# Patient Record
Sex: Female | Born: 1985 | Race: White | Hispanic: No | Marital: Married
Health system: Southern US, Community
[De-identification: ages and names within clinical notes are randomized; demographics above are authoritative.]

## PROBLEM LIST (undated history)

## (undated) DIAGNOSIS — G932 Benign intracranial hypertension: Secondary | ICD-10-CM

## (undated) DIAGNOSIS — G43909 Migraine, unspecified, not intractable, without status migrainosus: Secondary | ICD-10-CM

## (undated) DIAGNOSIS — E079 Disorder of thyroid, unspecified: Secondary | ICD-10-CM

## (undated) HISTORY — PX: LUMBAR PUNCTURE: SHX1985

## (undated) HISTORY — PX: LYMPH NODE BIOPSY: SHX201

## (undated) HISTORY — PX: TONSILLECTOMY: SUR1361

---

## 2020-02-10 ENCOUNTER — Emergency Department: Payer: 59

## 2020-02-10 ENCOUNTER — Encounter: Payer: Self-pay | Admitting: Emergency Medicine

## 2020-02-10 ENCOUNTER — Emergency Department
Admission: EM | Admit: 2020-02-10 | Discharge: 2020-02-10 | Disposition: A | Discharge status: Home / Self Care | Payer: 59 | Attending: Emergency Medicine | Admitting: Emergency Medicine

## 2020-02-10 ENCOUNTER — Other Ambulatory Visit: Payer: Self-pay

## 2020-02-10 DIAGNOSIS — R0789 Other chest pain: Secondary | ICD-10-CM | POA: Insufficient documentation

## 2020-02-10 DIAGNOSIS — M542 Cervicalgia: Secondary | ICD-10-CM | POA: Diagnosis not present

## 2020-02-10 DIAGNOSIS — M5410 Radiculopathy, site unspecified: Secondary | ICD-10-CM | POA: Diagnosis not present

## 2020-02-10 DIAGNOSIS — R202 Paresthesia of skin: Secondary | ICD-10-CM | POA: Diagnosis not present

## 2020-02-10 DIAGNOSIS — M79602 Pain in left arm: Secondary | ICD-10-CM | POA: Diagnosis present

## 2020-02-10 DIAGNOSIS — M792 Neuralgia and neuritis, unspecified: Secondary | ICD-10-CM

## 2020-02-10 LAB — CBC
HCT: 40.1 % (ref 36.0–46.0)
Hemoglobin: 13.6 g/dL (ref 12.0–15.0)
MCH: 27.5 pg (ref 26.0–34.0)
MCHC: 33.9 g/dL (ref 30.0–36.0)
MCV: 81 fL (ref 80.0–100.0)
Platelets: 221 10*3/uL (ref 150–400)
RBC: 4.95 MIL/uL (ref 3.87–5.11)
RDW: 13.6 % (ref 11.5–15.5)
WBC: 8 10*3/uL (ref 4.0–10.5)
nRBC: 0 % (ref 0.0–0.2)

## 2020-02-10 LAB — BASIC METABOLIC PANEL
Anion gap: 10 (ref 5–15)
BUN: 12 mg/dL (ref 6–20)
CO2: 28 mmol/L (ref 22–32)
Calcium: 9.6 mg/dL (ref 8.9–10.3)
Chloride: 102 mmol/L (ref 98–111)
Creatinine, Ser: 0.66 mg/dL (ref 0.44–1.00)
GFR calc Af Amer: >60 mL/min (ref >60)
GFR calc non Af Amer: >60 mL/min (ref >60)
Glucose, Bld: 88 mg/dL (ref 70–99)
Potassium: 3.8 mmol/L (ref 3.5–5.1)
Sodium: 140 mmol/L (ref 135–145)

## 2020-02-10 LAB — POCT PREGNANCY, URINE: Preg Test, Ur: NEGATIVE

## 2020-02-10 LAB — TROPONIN I (HIGH SENSITIVITY): Troponin I (High Sensitivity): <2 ng/L (ref <18)

## 2020-02-10 MED ORDER — PREDNISONE 20 MG PO TABS: 40.0000 mg | ORAL_TABLET | Freq: Every day | ORAL | 0 refills | Status: DC

## 2020-02-10 MED ORDER — PREDNISONE 20 MG PO TABS
40.0000 mg | ORAL_TABLET | Freq: Once | ORAL | Status: AC
  Administered 2020-02-10: 40 mg via ORAL
  Filled 2020-02-10: qty 2

## 2020-02-10 NOTE — ED Provider Notes (Signed)
[facility] Emergency Department Provider Note   ____________________________________________   First MD Initiated Contact with Patient 02/10/20 1815     (approximate)  I have reviewed the triage vital signs and the nursing notes.   HISTORY  Chief Complaint Numbness, Chest Pain, and Leg Pain    HPI Mariah Acosta is a 34 y.o. female reports no significant past medical history  Patient reports that after driving home from work a couple days ago she noticed that she was having some symptoms of a tingling feeling in the fingers of her left hand it would seem to have a lancinating discomfort and sometimes a burning or weird feeling seems to start at the base of her neck goes down towards the left hand or sometimes from the left hand up towards her neck and across the very front of her upper chest collarbone area  She is also noticed a couple of times where she felt like she was having some tingling in her toes as well as fingers of both hands.  Her symptoms are improved right now, but seem to be worsened when she works or does things like driving and particularly affects the left arm  Mild discomfort in the left neck a   Does not believe herself to be pregnant.  No abdominal pain.  Denies having "chest pain" rather reports is really more like an upper chest but associated with a feeling of tingling in strange sensation involving the left hand  No weakness except while using the hand on a couple occasions she has no other just feels a little different slightly weak but it goes away pretty quickly after allowing the hand to rest  No falls or injuries.  No fevers.  No headache.  No difficulty walking.  No speech changes.  No past medical history on file.  She denies medical history  There are no problems to display for this patient.    Takes no medication  Patient has no known allergies.  No family history on file.  Social History Social History     Tobacco Use  . Smoking status: Not on file  Substance Use Topics  . Alcohol use: Not on file  . Drug use: Not on file  Denies any alcohol use, denies any smoking history  Review of Systems Constitutional: No fever/chills Eyes: No visual changes. ENT: No sore throat.  Some slight achiness in the left side of her lower neck at times over the last few days Cardiovascular: Denies chest pain. Respiratory: Denies shortness of breath. Gastrointestinal: No abdominal pain.   Genitourinary: Denies pregnancy Musculoskeletal: Negative for back pain. Skin: Negative for rash. Neurological: Negative for headaches    ____________________________________________   PHYSICAL EXAM:  VITAL SIGNS: ED Triage Vitals [02/10/20 1341]  Enc Vitals Group     BP (!) 144/95     Pulse Rate 88     Resp 18     Temp 98.3 F (36.8 C)     Temp Source Oral     SpO2 100 %     Weight (!) 310 lb (140.6 kg)     Height 5\' 10"  (1.778 m)     Head Circumference      Peak Flow      Pain Score 7     Pain Loc      Pain Edu?      Excl. in [place]?     Constitutional: Alert and oriented. Well appearing and in no acute distress. Eyes: Conjunctivae  are normal. Head: Atraumatic. Nose: No congestion/rhinnorhea. Mouth/Throat: Mucous membranes are moist. Neck: No stridor.  Cardiovascular: Normal rate, regular rhythm. Grossly normal heart sounds.  Good peripheral circulation. Respiratory: Normal respiratory effort.  No retractions. Lungs CTAB. Gastrointestinal: Soft and nontender. No distention. Musculoskeletal: No lower extremity tenderness nor edema. Neurologic:  Normal speech and language. No gross focal neurologic deficits are appreciated.  Skin:  Skin is warm, dry and intact. No rash noted. Psychiatric: Mood and affect are normal. Speech and behavior are normal.  ____________________________________________   LABS (all labs ordered are listed, but only abnormal results are displayed)  Labs Reviewed   BASIC METABOLIC PANEL  CBC  POC URINE PREG, ED  POCT PREGNANCY, URINE  TROPONIN I (HIGH SENSITIVITY)   ____________________________________________  EKG  ED ECG REPORT I, Sharyn Creamer, the attending physician, personally viewed and interpreted this ECG.  Date: 02/11/2020 EKG Time: 1345 Rate: 85 Rhythm: normal sinus rhythm QRS Axis: normal Intervals: normal ST/T Wave abnormalities: Very minimal nonspecific abnormality Narrative Interpretation: no evidence of acute ischemia, very minimal nonspecific T wave abnormality  ____________________________________________  RADIOLOGY  DG Chest 2 View  Result Date: 02/10/2020 CLINICAL DATA:  Chest pain EXAM: CHEST - 2 VIEW COMPARISON:  None. FINDINGS: Lungs are clear. Heart size and pulmonary vascularity are normal. No adenopathy. No pneumothorax. No bone lesions. IMPRESSION: Lungs clear.  Cardiac silhouette normal. Electronically Signed   By: Bretta Bang III M.D.   On: 02/10/2020 14:34    Chest x-ray normal ____________________________________________   PROCEDURES  Procedure(s) performed: None  Procedures  Critical Care performed: No  ____________________________________________   INITIAL IMPRESSION / ASSESSMENT AND PLAN / ED COURSE  Pertinent labs & imaging results that were available during my care of the patient were reviewed by me and considered in my medical decision making (see chart for details).   Patient transfer symptoms that seem to indicate radicular pain likely originating possibly from an left lower cervical neck region.  No signs or symptoms suggest acute focal abnormality or severe spinal compression.  Very reassuring examination normal neurologic exam at this time.  No pronator drift.  Good use of both upper extremities.  Normal median radial ulnar nerve examination of the left hand.  Strong radial pulse left hand  Symptoms very atypical seem to suggest likely neuropathic type pain I suspect  potentially from mild cervical radiculopathy or alternatively other causes such as carpal tunnel type etiology.    No evidence of ACS  HEAR Score: 1    Discussed with the patient, will trial steroid and traditional over-the-counter treatment such as ibuprofen which I recommended to her.  Discussed careful return precautions, advised that and recommend she follow-up with her primary care physician which she is agreeable with.  Advised her if her symptoms are worsening she will return anytime, will consider potentially obtaining MRI of her neck if the symptoms continue to worsen or further work-up as needed.  Return precautions and treatment recommendations and follow-up discussed with the patient who is agreeable with the plan.       ____________________________________________   FINAL CLINICAL IMPRESSION(S) / ED DIAGNOSES  Final diagnoses:  Radicular pain in left arm  Paresthesia        Note:  This document was prepared using Dragon voice recognition software and may include unintentional dictation errors       Sharyn Creamer, MD 02/11/20 [phone]

## 2020-02-10 NOTE — Discharge Instructions (Signed)
Please follow-up closely with your primary care doctor.  Return to the emergency room right away if you start developing muscle weakness, loss of sensation, chest pain, fever, trouble breathing or other new concerns or symptoms arise.

## 2020-02-10 NOTE — ED Triage Notes (Signed)
Pt comes POV with chest pain, nausea, bilateral leg pain, and reports left arm tingling 2 days ago.

## 2022-01-19 ENCOUNTER — Other Ambulatory Visit: Payer: Self-pay

## 2022-01-19 ENCOUNTER — Emergency Department: Payer: Commercial Managed Care - PPO

## 2022-01-19 ENCOUNTER — Emergency Department
Admission: EM | Admit: 2022-01-19 | Discharge: 2022-01-19 | Disposition: A | Discharge status: Home / Self Care | Payer: Commercial Managed Care - PPO | Attending: Emergency Medicine | Admitting: Emergency Medicine

## 2022-01-19 DIAGNOSIS — R0602 Shortness of breath: Secondary | ICD-10-CM | POA: Diagnosis not present

## 2022-01-19 DIAGNOSIS — R079 Chest pain, unspecified: Secondary | ICD-10-CM | POA: Insufficient documentation

## 2022-01-19 DIAGNOSIS — M79605 Pain in left leg: Secondary | ICD-10-CM | POA: Diagnosis present

## 2022-01-19 LAB — CBC
HCT: 41.4 % (ref 36.0–46.0)
Hemoglobin: 13.2 g/dL (ref 12.0–15.0)
MCH: 27 pg (ref 26.0–34.0)
MCHC: 31.9 g/dL (ref 30.0–36.0)
MCV: 84.7 fL (ref 80.0–100.0)
Platelets: 241 10*3/uL (ref 150–400)
RBC: 4.89 MIL/uL (ref 3.87–5.11)
RDW: 13.2 % (ref 11.5–15.5)
WBC: 9.8 10*3/uL (ref 4.0–10.5)
nRBC: 0 % (ref 0.0–0.2)

## 2022-01-19 LAB — BASIC METABOLIC PANEL
Anion gap: 5 (ref 5–15)
BUN: 11 mg/dL (ref 6–20)
CO2: 31 mmol/L (ref 22–32)
Calcium: 9.4 mg/dL (ref 8.9–10.3)
Chloride: 103 mmol/L (ref 98–111)
Creatinine, Ser: 0.68 mg/dL (ref 0.44–1.00)
GFR, Estimated: >60 mL/min (ref >60)
Glucose, Bld: 101 mg/dL — ABNORMAL HIGH (ref 70–99)
Potassium: 4 mmol/L (ref 3.5–5.1)
Sodium: 139 mmol/L (ref 135–145)

## 2022-01-19 LAB — TROPONIN I (HIGH SENSITIVITY): Troponin I (High Sensitivity): 3 ng/L (ref <18)

## 2022-01-19 MED ORDER — CYCLOBENZAPRINE HCL 5 MG PO TABS: 5.0000 mg | ORAL_TABLET | Freq: Three times a day (TID) | ORAL | 0 refills | Status: DC | PRN

## 2022-01-19 NOTE — ED Triage Notes (Signed)
Pt to ED for left leg pain that started a few days ago, now reports has progressed to intermittent shob and chest pain. Reports worsening leg pain with movement. Denies chest pain at this time.  ?Ambulatory to triage with NAD noted  ?

## 2022-01-19 NOTE — ED Provider Notes (Signed)
? ? ?[facility] ?Emergency Department Provider Note ? ? ? ? Event Date/Time  ? First MD Initiated Contact with Patient 01/19/22 1140   ?  (approximate) ? ? ?History  ? ?Leg Pain ? ? ?HPI ? ?Mariah Acosta is a 36 y.o. 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  Patient also reports intermittent episodes of shortness of breath and chest pain.  She reports her left leg pain is aggravated by movement.  She denies any current chest pain, hemoptysis, or weakness.  She also denies any recent injury, trauma, or falls.  He does endorse some seasonal allergies causing postnasal drainage and some chest tightness, and a minor cough. ? ? ?Physical Exam  ? ?Triage Vital Signs: ?ED Triage Vitals  ?Enc Vitals Group  ?   BP 01/19/22 1047 (!) 140/108  ?   Pulse Rate 01/19/22 1047 86  ?   Resp 01/19/22 1047 18  ?   Temp 01/19/22 1047 98.5 ?F (36.9 ?C)  ?   Temp Source 01/19/22 1047 Oral  ?   SpO2 01/19/22 1047 98 %  ?   Weight 01/19/22 1048 298 lb (135.2 kg)  ?   Height 01/19/22 1048 5\' 9"  (1.753 m)  ?   Head Circumference --   ?   Peak Flow --   ?   Pain Score 01/19/22 1048 5  ?   Pain Loc --   ?   Pain Edu? --   ?   Excl. in [place]? --   ? ? ?Most recent vital signs: ?Vitals:  ? 01/19/22 1047 01/19/22 1228  ?BP: (!) 140/108 (!) 138/99  ?Pulse: 86 88  ?Resp: 18 18  ?Temp: 98.5 ?F (36.9 ?C)   ?SpO2: 98% 98%  ? ? ?General Awake, no distress.  ?CV:  Good peripheral perfusion. RRR ?RESP:  Normal effort. CTA ?ABD:  No distention.  ?MSK:  Normal spinal alignment without midline tenderness, spasm, vomiting, or step-off.  Full active range of motion of lower extremities bilaterally.  Left knee without deformity or effusion.  Patient with soft tissue swelling to the bilateral proximal anterior tibia. ?SKIN:  No ecchymosis, bruising, or erythema, or induration noted to the lower extremities. ? ? ?ED Results / Procedures /  Treatments  ? ?Labs ?(all labs ordered are listed, but only abnormal results are displayed) ?Labs Reviewed  ?BASIC METABOLIC PANEL - Abnormal; Notable for the following components:  ?    Result Value  ? Glucose, Bld 101 (*)   ? All other components within normal limits  ?CBC  ?POC URINE PREG, ED  ?TROPONIN I (HIGH SENSITIVITY)  ?TROPONIN I (HIGH SENSITIVITY)  ? ? ? ?EKG ? ?Vent. rate 91 BPM ?PR interval 156 ms ?QRS duration 78 ms ?QT/QTcB 360/442 ms ?P-R-T axes 34 -11 4 ?NSR ?Non specific ST changes ?No STEMI ? ? ?RADIOLOGY ? ?I personally viewed and evaluated these images as part of my medical decision making, as well as reviewing the written report by the radiologist. ? ?ED Provider Interpretation: no acute findings} ? ?DG Chest 2 View ? ?Result Date: 01/19/2022 ?CLINICAL DATA:  Shortness of breath. EXAM: CHEST - 2 VIEW COMPARISON:  02/10/2020 FINDINGS: The heart size and mediastinal contours are within normal limits. Both lungs are clear. The visualized skeletal structures are unremarkable. IMPRESSION: No active cardiopulmonary disease. Electronically Signed   By: 02/12/2020 M.D.   On: 01/19/2022  11:06  ? ?US Venous Img Lower Unilateral Left ? ?Result Date: 01/19/2022 ?CLINICAL DATA:  Left lower extremity pain, shortness of breath EXAM: LEFT LOWER EXTREMITY VENOUS DOPPLER ULTRASOUND TECHNIQUE: Gray-scale sonography with compression, as well as color and duplex ultrasound, were performed to evaluate the deep venous system(s) from the level of the common femoral vein through the popliteal and proximal calf veins. COMPARISON:  None Available. FINDINGS: VENOUS Normal compressibility of the common femoral, superficial femoral, and popliteal veins, as well as the visualized calf veins. Visualized portions of profunda femoral vein and great saphenous vein unremarkable. No filling defects to suggest DVT on grayscale or color Doppler imaging. Doppler waveforms show normal direction of venous flow, normal respiratory  plasticity and response to augmentation. Limited views of the contralateral common femoral vein are unremarkable. OTHER Incidental note is made of 2 prominent lymph nodes in the left groin, the largest of which measures up to 2.3 x 0.7 x 1.7 cm, which could be reactive. Limitations: none IMPRESSION: Negative for DVT in the left lower extremity. Electronically Signed   By: Wiliam Ke M.D.   On: 01/19/2022 12:05   ? ? ?PROCEDURES: ? ?Critical Care performed: No ? ?Procedures ? ? ?MEDICATIONS ORDERED IN ED: ?Medications - No data to display ? ? ?IMPRESSION / MDM / ASSESSMENT AND PLAN / ED COURSE  ?I reviewed the triage vital signs and the nursing notes. ?             ?               ? ?Differential diagnosis includes, but is not limited to, viral syndrome, bronchitis including COPD exacerbation, pneumonia, reactive airway disease including asthma, CHF including exacerbation with or without pulmonary/interstitial edema, pneumothorax, ACS, thoracic trauma, and pulmonary embolism. ? ? ?Patient to the ED for evaluation of anterior left leg pain that started few days ago.  Patient reports some intermittent cramping to the anterior leg and some intermittent episodes of cough and chest tightness.  She is evaluated for her complaints in the ED, found to have overall reassuring work-up.  No radiologic evidence of an acute intrathoracic process on chest x-ray, and ultrasound is negative for DVT.  Patient with a low pretest probability for PE/DVT based on her Wells criteria.  Low suspicion for ACS given patient's normal troponin and reassuring EKG without malignant arrhythmia.  Patient's diagnosis is consistent with leg pain and cramping. Patient will be discharged home with prescriptions for cyclobenzaprine. Patient is to follow up with her primary provider as needed or otherwise directed. Patient is given ED precautions to return to the ED for any worsening or new symptoms. ? ? ?FINAL CLINICAL IMPRESSION(S) / ED DIAGNOSES   ? ?Final diagnoses:  ?Left leg pain  ? ? ? ?Rx / DC Orders  ? ?ED Discharge Orders   ? ?      Ordered  ?  cyclobenzaprine (FLEXERIL) 5 MG tablet  3 times daily PRN       ? 01/19/22 1218  ? ?  ?  ? ?  ? ? ? ?Note:  This document was prepared using Dragon voice recognition software and may include unintentional dictation errors. ? ?  ?Lissa Hoard, PA-C ?01/19/22 1335 ? ?  ?Delton Prairie, MD ?01/19/22 1526 ? ?

## 2022-01-19 NOTE — Discharge Instructions (Addendum)
Your exam, labs, and ultrasound are normal and reassuring. You do not have evidence of a blood clot. Take the prescription muscle relaxant and follow-up with your provider for continued symptoms. Return to the ED if needed.  ?

## 2022-11-19 ENCOUNTER — Emergency Department
Admission: EM | Admit: 2022-11-19 | Discharge: 2022-11-19 | Disposition: A | Discharge status: Home / Self Care | Payer: Commercial Managed Care - PPO | Attending: Emergency Medicine | Admitting: Emergency Medicine

## 2022-11-19 ENCOUNTER — Other Ambulatory Visit: Payer: Self-pay

## 2022-11-19 ENCOUNTER — Emergency Department: Payer: Commercial Managed Care - PPO

## 2022-11-19 ENCOUNTER — Encounter: Payer: Self-pay | Admitting: Emergency Medicine

## 2022-11-19 DIAGNOSIS — I10 Essential (primary) hypertension: Secondary | ICD-10-CM | POA: Diagnosis not present

## 2022-11-19 DIAGNOSIS — R519 Headache, unspecified: Secondary | ICD-10-CM | POA: Diagnosis not present

## 2022-11-19 LAB — BASIC METABOLIC PANEL
Anion gap: 9 (ref 5–15)
BUN: <5 mg/dL — ABNORMAL LOW (ref 6–20)
CO2: 25 mmol/L (ref 22–32)
Calcium: 8.9 mg/dL (ref 8.9–10.3)
Chloride: 103 mmol/L (ref 98–111)
Creatinine, Ser: 0.61 mg/dL (ref 0.44–1.00)
GFR, Estimated: >60 mL/min (ref >60)
Glucose, Bld: 160 mg/dL — ABNORMAL HIGH (ref 70–99)
Potassium: 3.3 mmol/L — ABNORMAL LOW (ref 3.5–5.1)
Sodium: 137 mmol/L (ref 135–145)

## 2022-11-19 LAB — CBC WITH DIFFERENTIAL/PLATELET
Abs Immature Granulocytes: 0.02 10*3/uL (ref 0.00–0.07)
Basophils Absolute: 0 10*3/uL (ref 0.0–0.1)
Basophils Relative: 0 %
Eosinophils Absolute: 0.1 10*3/uL (ref 0.0–0.5)
Eosinophils Relative: 2 %
HCT: 35.7 % — ABNORMAL LOW (ref 36.0–46.0)
Hemoglobin: 11.4 g/dL — ABNORMAL LOW (ref 12.0–15.0)
Immature Granulocytes: 0 %
Lymphocytes Relative: 23 %
Lymphs Abs: 1.2 10*3/uL (ref 0.7–4.0)
MCH: 26 pg (ref 26.0–34.0)
MCHC: 31.9 g/dL (ref 30.0–36.0)
MCV: 81.3 fL (ref 80.0–100.0)
Monocytes Absolute: 0.2 10*3/uL (ref 0.1–1.0)
Monocytes Relative: 5 %
Neutro Abs: 3.6 10*3/uL (ref 1.7–7.7)
Neutrophils Relative %: 70 %
Platelets: 235 10*3/uL (ref 150–400)
RBC: 4.39 MIL/uL (ref 3.87–5.11)
RDW: 13.3 % (ref 11.5–15.5)
WBC: 5.1 10*3/uL (ref 4.0–10.5)
nRBC: 0 % (ref 0.0–0.2)

## 2022-11-19 LAB — URINALYSIS, ROUTINE W REFLEX MICROSCOPIC
Bilirubin Urine: NEGATIVE
Glucose, UA: 50 mg/dL — AB
Hgb urine dipstick: NEGATIVE
Ketones, ur: NEGATIVE mg/dL
Leukocytes,Ua: NEGATIVE
Nitrite: NEGATIVE
Protein, ur: NEGATIVE mg/dL
Specific Gravity, Urine: 1.006 (ref 1.005–1.030)
pH: 9 — ABNORMAL HIGH (ref 5.0–8.0)

## 2022-11-19 LAB — POC URINE PREG, ED: Preg Test, Ur: NEGATIVE

## 2022-11-19 MED ORDER — BUTALBITAL-APAP-CAFFEINE 50-325-40 MG PO TABS: 1.0000 | ORAL_TABLET | Freq: Four times a day (QID) | ORAL | 0 refills | Status: AC | PRN

## 2022-11-19 NOTE — ED Provider Notes (Signed)
[facility] Provider Note    Event Date/Time   First MD Initiated Contact with Patient 11/19/22 [phone]     (approximate)   History   Headache   HPI  Mariah Acosta is a 37 y.o. female   presents to the ED with complaint of left-sided headache for approximately 1 week.  Patient states that she saw her PCP and was placed on Augmentin for a potential sinusitis.  Patient states that she did not have any upper respiratory symptoms at the time, fever or dental/facial pain.  Patient states that the Augmentin has not not given her any relief.  She reports nausea mostly when she is eating.  No visual changes, no photophobia, no increased headache with positional changes or dizziness.  Patient also denies any upper or strange symptoms, fever, chills, nausea or vomiting.  No known sick exposures.  Patient reports that she had a diagnosis of sarcoidosis in 2004 but reports that she is "in remission" and has not had any problems in many years.  Patient has multiple visits to [facility] for muscle skeletal complaints for both PT and orthopedics.      Physical Exam   Triage Vital Signs: ED Triage Vitals [11/19/22 0832]  Enc Vitals Group     BP (!) 133/96     Pulse Rate 99     Resp 18     Temp 97.8 F (36.6 C)     Temp Source Oral     SpO2 100 %     Weight 298 lb 1 oz (135.2 kg)     Height '5\' 9"'$  (1.753 m)     Head Circumference      Peak Flow      Pain Score 9     Pain Loc      Pain Edu?      Excl. in [place]?     Most recent vital signs: Vitals:   11/19/22 1308 11/19/22 1309  BP: 132/76   Pulse: 80   Resp: 18   Temp:  98 F (36.7 C)  SpO2: 99%      General: Awake, no distress.  Alert, talkative, answers questions appropriately.  No photophobia appreciated in the exam room. CV:  Good peripheral perfusion.  Resp:  Normal effort.  Lungs are clear bilaterally. Abd:  No distention.  Other:  PERRLA, EOMI's, cranial nerves II through XII grossly intact.  Normal  speech, good muscle strength bilaterally at 5/5.  No tenderness on palpation of cervical spine posteriorly.  Range of motion in all 4 planes cervical spine is without restriction.  There is some tenderness noted on palpation of the trapezius muscles bilaterally.  Range of motion of the upper extremities is without restriction.   ED Results / Procedures / Treatments   Labs (all labs ordered are listed, but only abnormal results are displayed) Labs Reviewed  CBC WITH DIFFERENTIAL/PLATELET - Abnormal; Notable for the following components:      Result Value   Hemoglobin 11.4 (*)    HCT 35.7 (*)    All other components within normal limits  BASIC METABOLIC PANEL - Abnormal; Notable for the following components:   Potassium 3.3 (*)    Glucose, Bld 160 (*)    BUN <5 (*)    All other components within normal limits  URINALYSIS, ROUTINE W REFLEX MICROSCOPIC - Abnormal; Notable for the following components:   Color, Urine STRAW (*)    APPearance CLEAR (*)    pH 9.0 (*)  Glucose, UA 50 (*)    All other components within normal limits  POC URINE PREG, ED       RADIOLOGY CT head without contrast shows no acute intracranial abnormality per radiologist.  There is mention of partial empty sella which can be seen in the setting of a idiopathic intracranial hypertension per radiology.  MRI CT without contrast per radiologist mentions the same partial empty sella which may be a normal anatomical variant versus findings that could be associated to intracranial hypertension.  PROCEDURES:  Critical Care performed:   Procedures   MEDICATIONS ORDERED IN ED: Medications - No data to display   IMPRESSION / MDM / [place] / ED COURSE  I reviewed the triage vital signs and the nursing notes.   Differential diagnosis includes, but is not limited to, migraine, muscle contraction headache, generalized headache, sinusitis.   I spoke with the neurologist on-call for [facility] Dr. Cristi Loron  about this patient and CT findings.  It was noted that patient does have a history of sarcoidosis per [facility] records but not noted in her [facility] ED chart.  An MRI brain without contrast was ordered to further evaluate the sella and patient was made aware.  She reports that she does not have claustrophobia and does not need sedation.  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  Plans are made for patient to follow-up with neurology.  I discussed MRI findings with patient and she is agreeable to follow-up with neurology over [facility].  A prescription for Fioricet was sent to the pharmacy for her to take 1 or 2 every 6 hours as needed for headache to see if this gives her any relief and also encouraged her to use ice or heat to the base of her neck for her muscles.     Patient's presentation is most consistent with acute complicated illness / injury requiring diagnostic workup.  FINAL CLINICAL IMPRESSION(S) / ED DIAGNOSES   Final diagnoses:  Left-sided headache     Rx / DC Orders   ED Discharge Orders          Ordered    butalbital-acetaminophen-caffeine (FIORICET) 50-325-40 MG tablet  Every 6 hours PRN        11/19/22 1548             Note:  This document was prepared using Dragon voice recognition software and may include unintentional dictation errors.   Johnn Hai, PA-C 11/19/22 1557    Arta Silence, MD 11/19/22 2005

## 2022-11-19 NOTE — Plan of Care (Signed)
Discussed with ED PA, head CT read as partial empty sella which can be seen in idiopathic intracranial hypotension, and I am asked to comment on any further workup she needs prior to discharge from ED  Per ED provider, patient has no alarm signs on her headache including no positionality, not waking her up at night, not worse when she is laying down, no vision changes.    On review of chart there is a history of migraine headache listed as well as sarcoid listed but this history was not elicited by ED provider.  Per ED provider exam is completely benign  Recommendations: If patient endorses that the history of sarcoidosis listed on her chart is accurate, would recommend MRI brain with and without contrast to exclude neurosarcoidosis Do not think a lumbar puncture is necessary if headache is not positional and patient is not having any vision symptoms  These are curbside recommendations based upon the information readily available in the chart on brief review as well as history and examination information provided to me by requesting provider and do not replace a full detailed consult

## 2022-11-19 NOTE — ED Triage Notes (Signed)
Pt here with a left sided headache x1 week. Pt states her provider put her on Augmentin with no relief. Pt states she is also nauseous, mostly after she eats.

## 2022-11-19 NOTE — Discharge Instructions (Signed)
Make an appointment with Dr. Gurney Maxin who is a neurologist at [facility] or his partner Dr. Manuella Ghazi.  A prescription for Fioricet was sent to your pharmacy to take 1 or 2 every 6 hours as needed for headache.  As we discussed do not drive or operate machinery while taking this medication as it could cause drowsiness.  You may also apply moist heat or ice to the base of your neck to see if this helps with your muscle tension.

## 2022-12-17 ENCOUNTER — Other Ambulatory Visit: Payer: Self-pay | Admitting: Physician Assistant

## 2022-12-17 DIAGNOSIS — G932 Benign intracranial hypertension: Secondary | ICD-10-CM

## 2022-12-22 NOTE — Progress Notes (Signed)
Patient for DG Lumbar Puncture on Wed 12/23/2022, I called and spoke with the patient on the phone and gave pre-procedure instructions. Pt was made aware to be here at 9:30a. Pt stated understanding.  Called 12/21/2022 and 12/22/2022

## 2022-12-23 ENCOUNTER — Ambulatory Visit
Admission: RE | Admit: 2022-12-23 | Discharge: 2022-12-23 | Disposition: A | Discharge status: Home / Self Care | Payer: 59 | Source: Ambulatory Visit | Attending: Physician Assistant | Admitting: Physician Assistant

## 2022-12-23 ENCOUNTER — Other Ambulatory Visit: Payer: Self-pay

## 2022-12-23 DIAGNOSIS — G932 Benign intracranial hypertension: Secondary | ICD-10-CM

## 2022-12-23 MED ORDER — ACETAMINOPHEN 325 MG PO TABS: 650.0000 mg | ORAL_TABLET | ORAL | Status: DC | PRN

## 2022-12-23 NOTE — Procedures (Addendum)
Technically successful fluoro guided LP at L4-L5 level with opening pressure of 23 cm H2O and closing pressure of <18cm H2O. 10 cc of clear CSF collected, but no labs were ordered as this was a therapeutic lumbar puncture for pseudotumor cerebri.  No immediate post procedural complication.  Please see imaging section of Epic for full dictation.    Mina Marble, PA-C 12/23/2022, 11:35 AM

## 2022-12-30 ENCOUNTER — Ambulatory Visit: Payer: 59

## 2023-01-12 NOTE — Addendum Note (Signed)
Encounter addended by: Jeanella Flattery, RN on: 01/12/2023 5:18 PM  Actions taken: Charge Capture section accepted

## 2023-01-20 ENCOUNTER — Ambulatory Visit
Admission: EM | Admit: 2023-01-20 | Discharge: 2023-01-20 | Disposition: A | Discharge status: Home / Self Care | Payer: Commercial Managed Care - PPO | Attending: Emergency Medicine | Admitting: Emergency Medicine

## 2023-01-20 DIAGNOSIS — H109 Unspecified conjunctivitis: Secondary | ICD-10-CM | POA: Diagnosis not present

## 2023-01-20 DIAGNOSIS — R030 Elevated blood-pressure reading, without diagnosis of hypertension: Secondary | ICD-10-CM

## 2023-01-20 DIAGNOSIS — J302 Other seasonal allergic rhinitis: Secondary | ICD-10-CM

## 2023-01-20 HISTORY — DX: Migraine, unspecified, not intractable, without status migrainosus: G43.909

## 2023-01-20 HISTORY — DX: Disorder of thyroid, unspecified: E07.9

## 2023-01-20 MED ORDER — POLYMYXIN B-TRIMETHOPRIM 10000-0.1 UNIT/ML-% OP SOLN: 1.0000 [drp] | Freq: Four times a day (QID) | OPHTHALMIC | 0 refills | Status: AC

## 2023-01-20 NOTE — Discharge Instructions (Addendum)
Avoid over-the-counter products that can elevate your blood pressure.  Your blood pressure is elevated today at 134/91.  Please have this rechecked by your primary care provider in 2-4 weeks.      Use the antibiotic eyedrops as prescribed.    Follow-up with your primary care provider if your symptoms are not improving.

## 2023-01-20 NOTE — ED Provider Notes (Signed)
[name]    CSN: [id number] Arrival date & time: 01/20/23  1450      History   Chief Complaint Chief Complaint  Patient presents with   Nasal Congestion    Sore throat for few days turned into sinus congestion and my eye is of concern - Entered by patient    HPI Mariah Acosta is a 37 y.o. female.   Patient presents with 5 day history of nasal congestion.  Treating with Mucinex severe cold medicine.  She also reports left eye drainage, redness, itching since this morning.  No eye injury, eye pain, change in vision.  No fever, chills, ear pain, sore throat, shortness of breath, or other symptoms.  Her medical history includes idiopathic intracranial hypertension, sarcoidosis, migraine headaches, obesity, tinnitus, hyperinsulinemia, asthma, anxiety, thyroid disease.  The history is provided by the patient, the spouse and medical records.    Past Medical History:  Diagnosis Date   Migraines    Thyroid disease     There are no problems to display for this patient.   Past Surgical History:  Procedure Laterality Date   LUMBAR PUNCTURE     LYMPH NODE BIOPSY     TONSILLECTOMY      OB History   No obstetric history on file.      Home Medications    Prior to Admission medications   Medication Sig Start Date End Date Taking? Authorizing Provider  trimethoprim-polymyxin b (POLYTRIM) ophthalmic solution Place 1 drop into both eyes 4 (four) times daily for 7 days. 01/20/23 01/27/23 Yes Mickie Bail, NP  acetaZOLAMIDE (DIAMOX) 250 MG tablet Take 250 mg by mouth 2 (two) times daily.    [provider]  amitriptyline (ELAVIL) 10 MG tablet Take 10 mg by mouth at bedtime.    [provider]  butalbital-acetaminophen-caffeine (FIORICET) 50-325-40 MG tablet Take 1-2 tablets by mouth every 6 (six) hours as needed for headache. 11/19/22 11/19/23  Tommi Rumps, PA-C  escitalopram (LEXAPRO) 20 MG tablet Take 20 mg by mouth daily.    [provider]   levothyroxine (SYNTHROID) 75 MCG tablet Take 75 mcg by mouth daily before breakfast.    [provider]  metformin (FORTAMET) 500 MG (OSM) 24 hr tablet Take 500 mg by mouth 2 (two) times daily with a meal.    [provider]    Family History History reviewed. No pertinent family history.  Social History Social History   Tobacco Use   Smoking status: Unknown   Smokeless tobacco: Never  Vaping Use   Vaping Use: Never used  Substance Use Topics   Alcohol use: Not Currently   Drug use: Not Currently     Allergies   Sumatriptan   Review of Systems Review of Systems  Constitutional:  Negative for chills and fever.  HENT:  Positive for congestion. Negative for ear pain and sore throat.   Eyes:  Positive for discharge, redness and itching. Negative for pain and visual disturbance.  Respiratory:  Positive for cough. Negative for shortness of breath.   Cardiovascular:  Negative for chest pain and palpitations.     Physical Exam Triage Vital Signs ED Triage Vitals  Enc Vitals Group     BP      Pulse      Resp      Temp      Temp src      SpO2      Weight      Height  Head Circumference      Peak Flow      Pain Score      Pain Loc      Pain Edu?      Excl. in GC?    No data found.  Updated Vital Signs BP (!) 134/91   Pulse 99   Temp 98.1 F (36.7 C)   Resp 18   LMP 01/16/2023   SpO2 96%   Visual Acuity Right Eye Distance:   Left Eye Distance:   Bilateral Distance:    Right Eye Near:   Left Eye Near:    Bilateral Near:     Physical Exam Vitals and nursing note reviewed.  Constitutional:      General: She is not in acute distress.    Appearance: She is well-developed. She is not ill-appearing.  HENT:     Right Ear: Tympanic membrane normal.     Left Ear: Tympanic membrane normal.     Nose: Nose normal.     Mouth/Throat:     Mouth: Mucous membranes are moist.     Pharynx: Oropharynx is clear.  Eyes:     General: Lids are  normal. Vision grossly intact.        Right eye: No discharge.     Extraocular Movements: Extraocular movements intact.     Conjunctiva/sclera:     Left eye: Left conjunctiva is injected.     Pupils: Pupils are equal, round, and reactive to light.     Comments: Green-yellow thick mucous drainage in left inner canthus.   Cardiovascular:     Rate and Rhythm: Normal rate and regular rhythm.     Heart sounds: Normal heart sounds.  Pulmonary:     Effort: Pulmonary effort is normal. No respiratory distress.     Breath sounds: Normal breath sounds.  Musculoskeletal:     Cervical back: Neck supple.  Skin:    General: Skin is warm and dry.  Neurological:     Mental Status: She is alert.  Psychiatric:        Mood and Affect: Mood normal.        Behavior: Behavior normal.      UC Treatments / Results  Labs (all labs ordered are listed, but only abnormal results are displayed) Labs Reviewed - No data to display  EKG   Radiology No results found.  Procedures Procedures (including critical care time)  Medications Ordered in UC Medications - No data to display  Initial Impression / Assessment and Plan / UC Course  I have reviewed the triage vital signs and the nursing notes.  Pertinent labs & imaging results that were available during my care of the patient were reviewed by me and considered in my medical decision making (see chart for details).   Left eye conjunctivitis, seasonal allergies, elevated blood pressure reading.  Cautioned patient to avoid OTC medications that can elevate her blood pressure.  Discussed that her blood pressure is mildly elevated today and instructed her to follow-up with her PCP for a recheck.  Instructed her to follow-up with her PCP or neurologist before taking OTC medications.  Treating conjunctivitis with Polytrim eyedrops.  Education provided on conjunctivitis and allergic rhinitis.  Instructed her to follow-up with her PCP if her symptoms are not  improving.  She agrees to plan of care.   Final Clinical Impressions(s) / UC Diagnoses   Final diagnoses:  Conjunctivitis of left eye, unspecified conjunctivitis type  Seasonal allergies  Elevated blood pressure reading  Discharge Instructions      Avoid over-the-counter products that can elevate your blood pressure.  Your blood pressure is elevated today at 134/91.  Please have this rechecked by your primary care provider in 2-4 weeks.      Use the antibiotic eyedrops as prescribed.    Follow-up with your primary care provider if your symptoms are not improving.        ED Prescriptions     Medication Sig Dispense Auth. Provider   trimethoprim-polymyxin b (POLYTRIM) ophthalmic solution Place 1 drop into both eyes 4 (four) times daily for 7 days. 10 mL Mickie Bail, NP      PDMP not reviewed this encounter.   Mickie Bail, NP 01/20/23 1525

## 2023-01-20 NOTE — ED Triage Notes (Signed)
Patient to Urgent Care with complaints of nasal congestion. Reports concerns about a possible sinus infection. Denies any fevers.  Also w/ complaints of left sided eye pain/ swelling/ drainage. Has been matted shut when she wakes up.   Symptoms started Friday. Has been taking mucinex.

## 2023-06-10 ENCOUNTER — Other Ambulatory Visit: Payer: Self-pay | Admitting: Physician Assistant

## 2023-06-10 DIAGNOSIS — M542 Cervicalgia: Secondary | ICD-10-CM

## 2023-06-16 ENCOUNTER — Ambulatory Visit
Admission: RE | Admit: 2023-06-16 | Discharge: 2023-06-16 | Disposition: A | Discharge status: Home / Self Care | Payer: Commercial Managed Care - PPO | Source: Ambulatory Visit | Attending: Physician Assistant | Admitting: Physician Assistant

## 2023-06-16 DIAGNOSIS — M542 Cervicalgia: Secondary | ICD-10-CM

## 2023-12-17 IMAGING — US US EXTREM LOW VENOUS*L*
1 series · 14 of 24 positions shown · non-contrast
Comparison: None Available.

CLINICAL DATA: Left lower extremity pain, shortness of breath

EXAM:
LEFT LOWER EXTREMITY VENOUS DOPPLER ULTRASOUND
TECHNIQUE: Gray-scale sonography with compression, as well as color and duplex
ultrasound, were performed to evaluate the deep venous system(s)
from the level of the common femoral vein through the popliteal and
proximal calf veins.

[Series 1: us venous img lower uni left (dvt) · portal-venous · 14 of 42 slices shown]
[im 1/42]
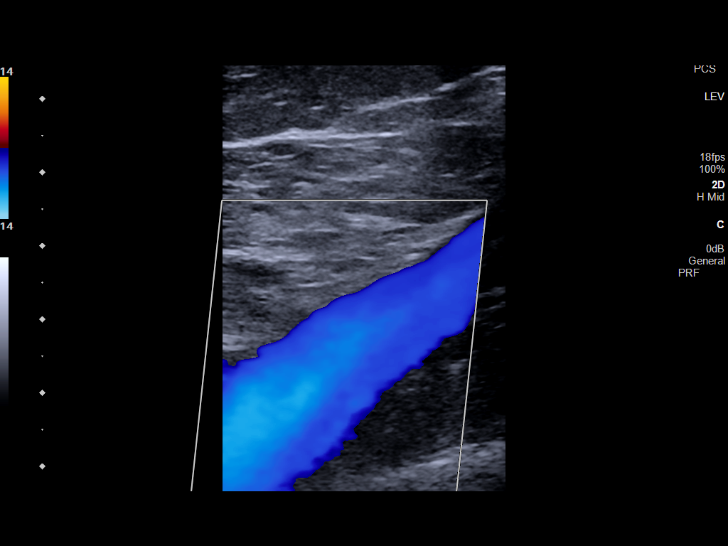
[im 4/42]
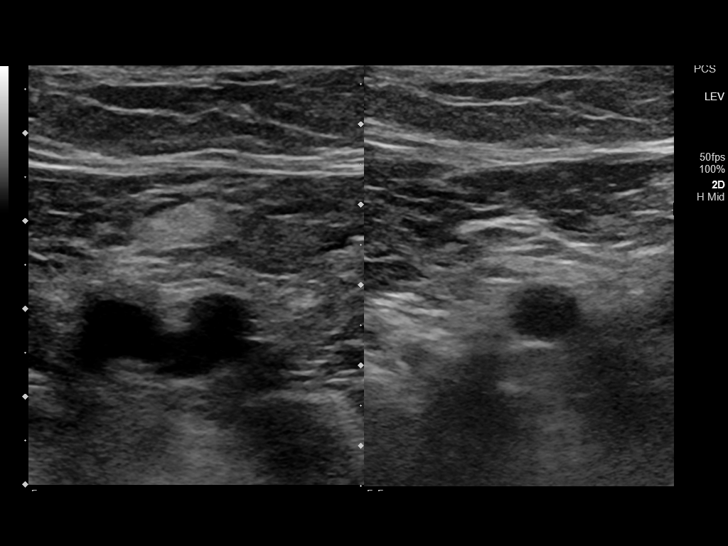
[im 8/42]
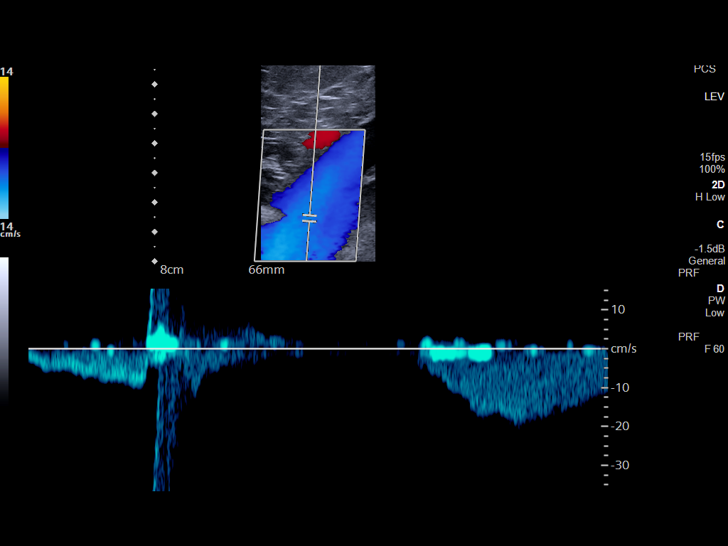
[im 11/42]
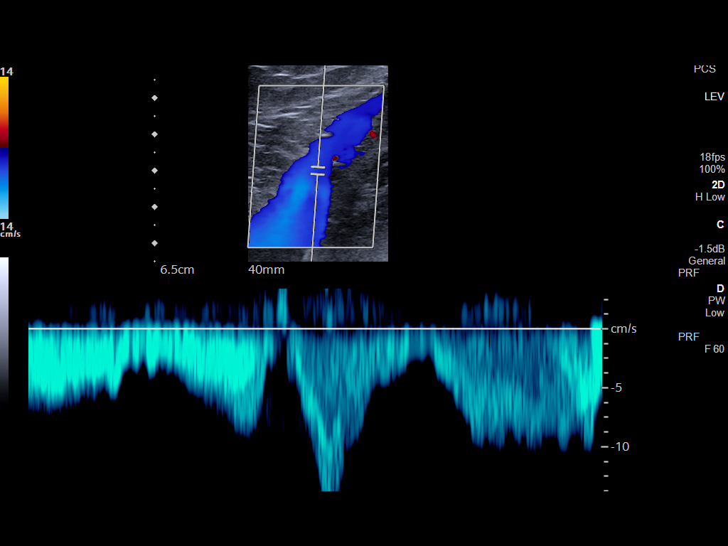
[im 13/42]
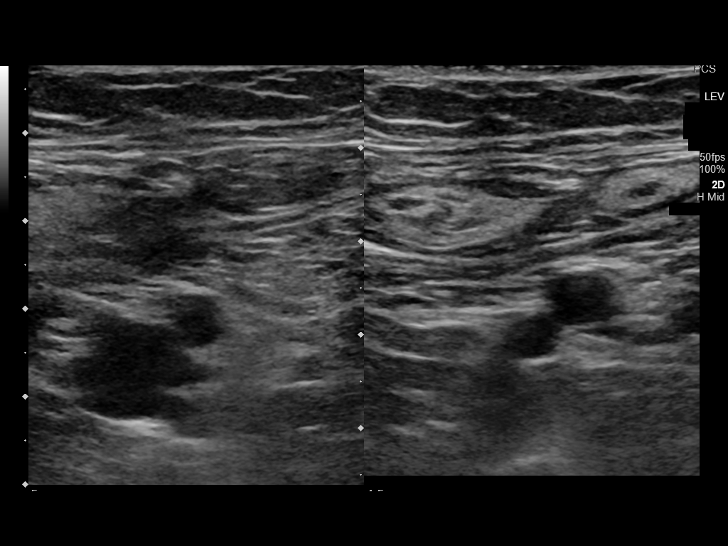
[im 17/42]
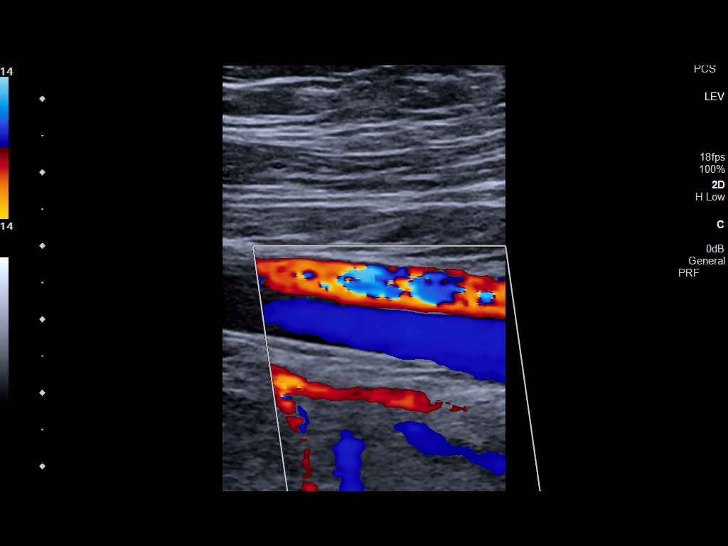
[im 20/42]
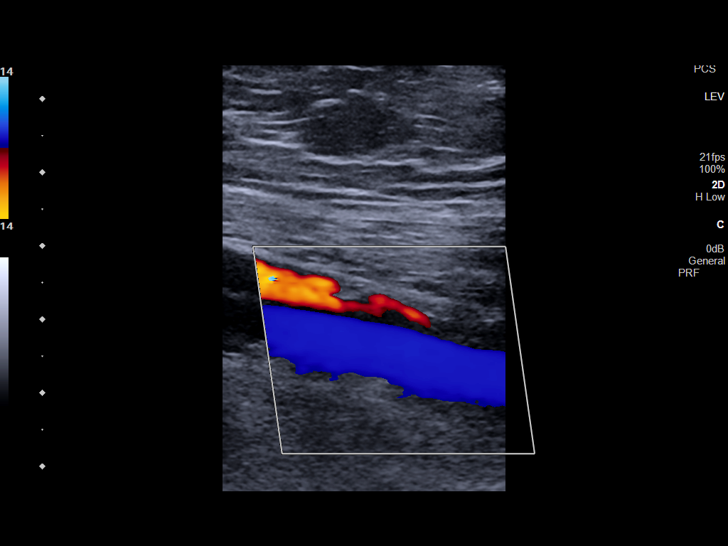
[im 22/42]
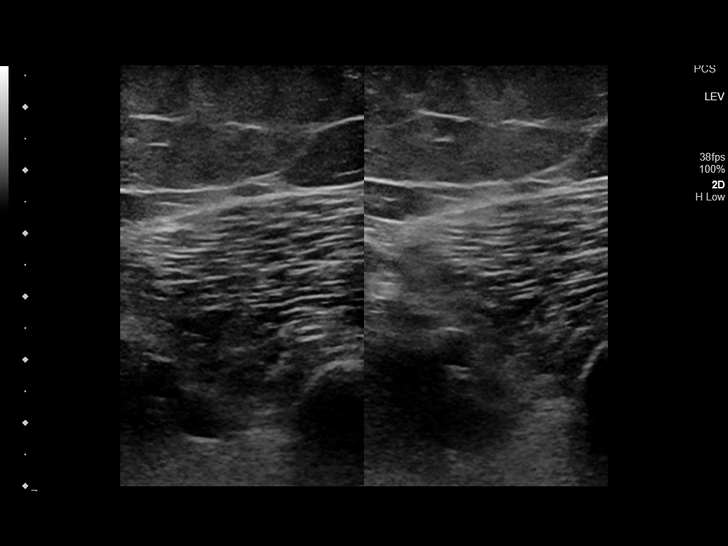
[im 25/42]
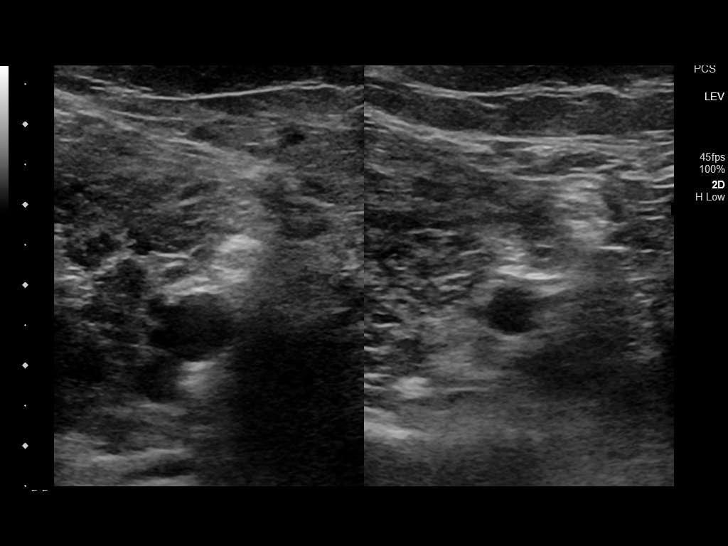
[im 29/42]
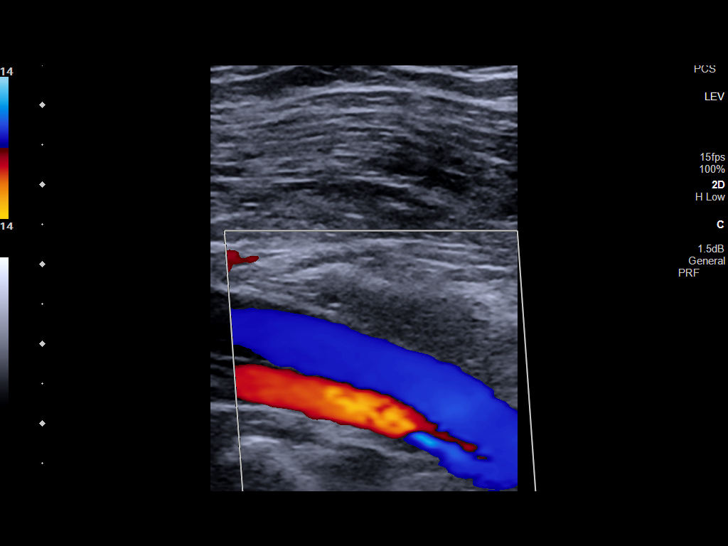
[im 33/42]
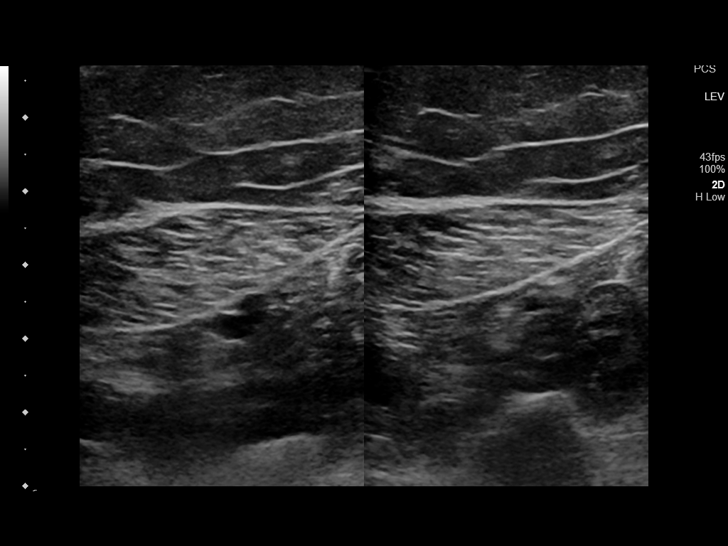
[im 34/42]
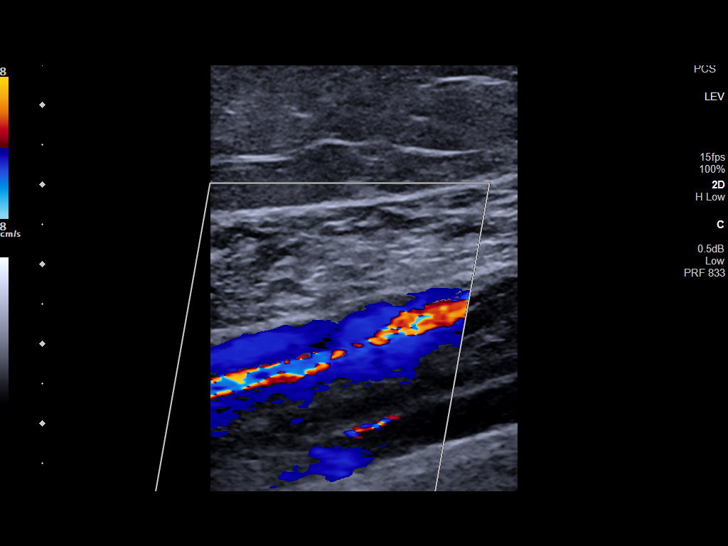
[im 38/42]
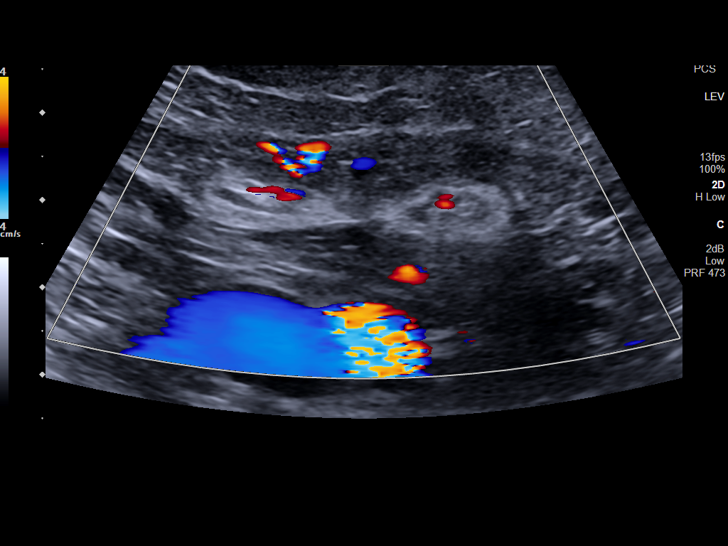
[im 42/42]
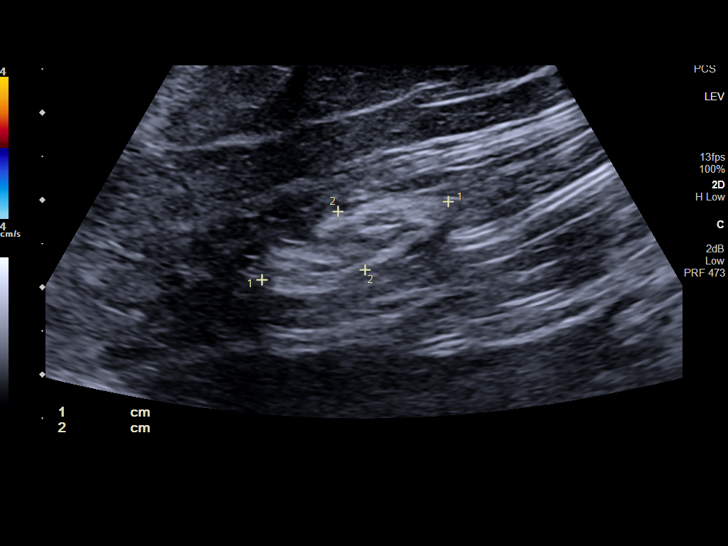

[14 of 24 positions shown; findings below may reference images not displayed]

FINDINGS: VENOUS

Normal compressibility of the common femoral, superficial femoral,
and popliteal veins, as well as the visualized calf veins.
Visualized portions of profunda femoral vein and great saphenous
vein unremarkable. No filling defects to suggest DVT on grayscale or
color Doppler imaging. Doppler waveforms show normal direction of
venous flow, normal respiratory plasticity and response to
augmentation.

Limited views of the contralateral common femoral vein are
unremarkable.

OTHER

Incidental note is made of 2 prominent lymph nodes in the left
groin, the largest of which measures up to 2.3 x 0.7 x 1.7 cm, which
could be reactive.

Limitations: none
IMPRESSION: Negative for DVT in the left lower extremity.

## 2023-12-17 IMAGING — CR DG CHEST 2V
1 series · 2 of 2 positions shown · non-contrast
Comparison: 02/10/2020

CLINICAL DATA: Shortness of breath.

EXAM:
CHEST - 2 VIEW

[Series 1: dg chest 2 view · 0.14mm/px · 2 of 2 slices shown]
[im 1/2]
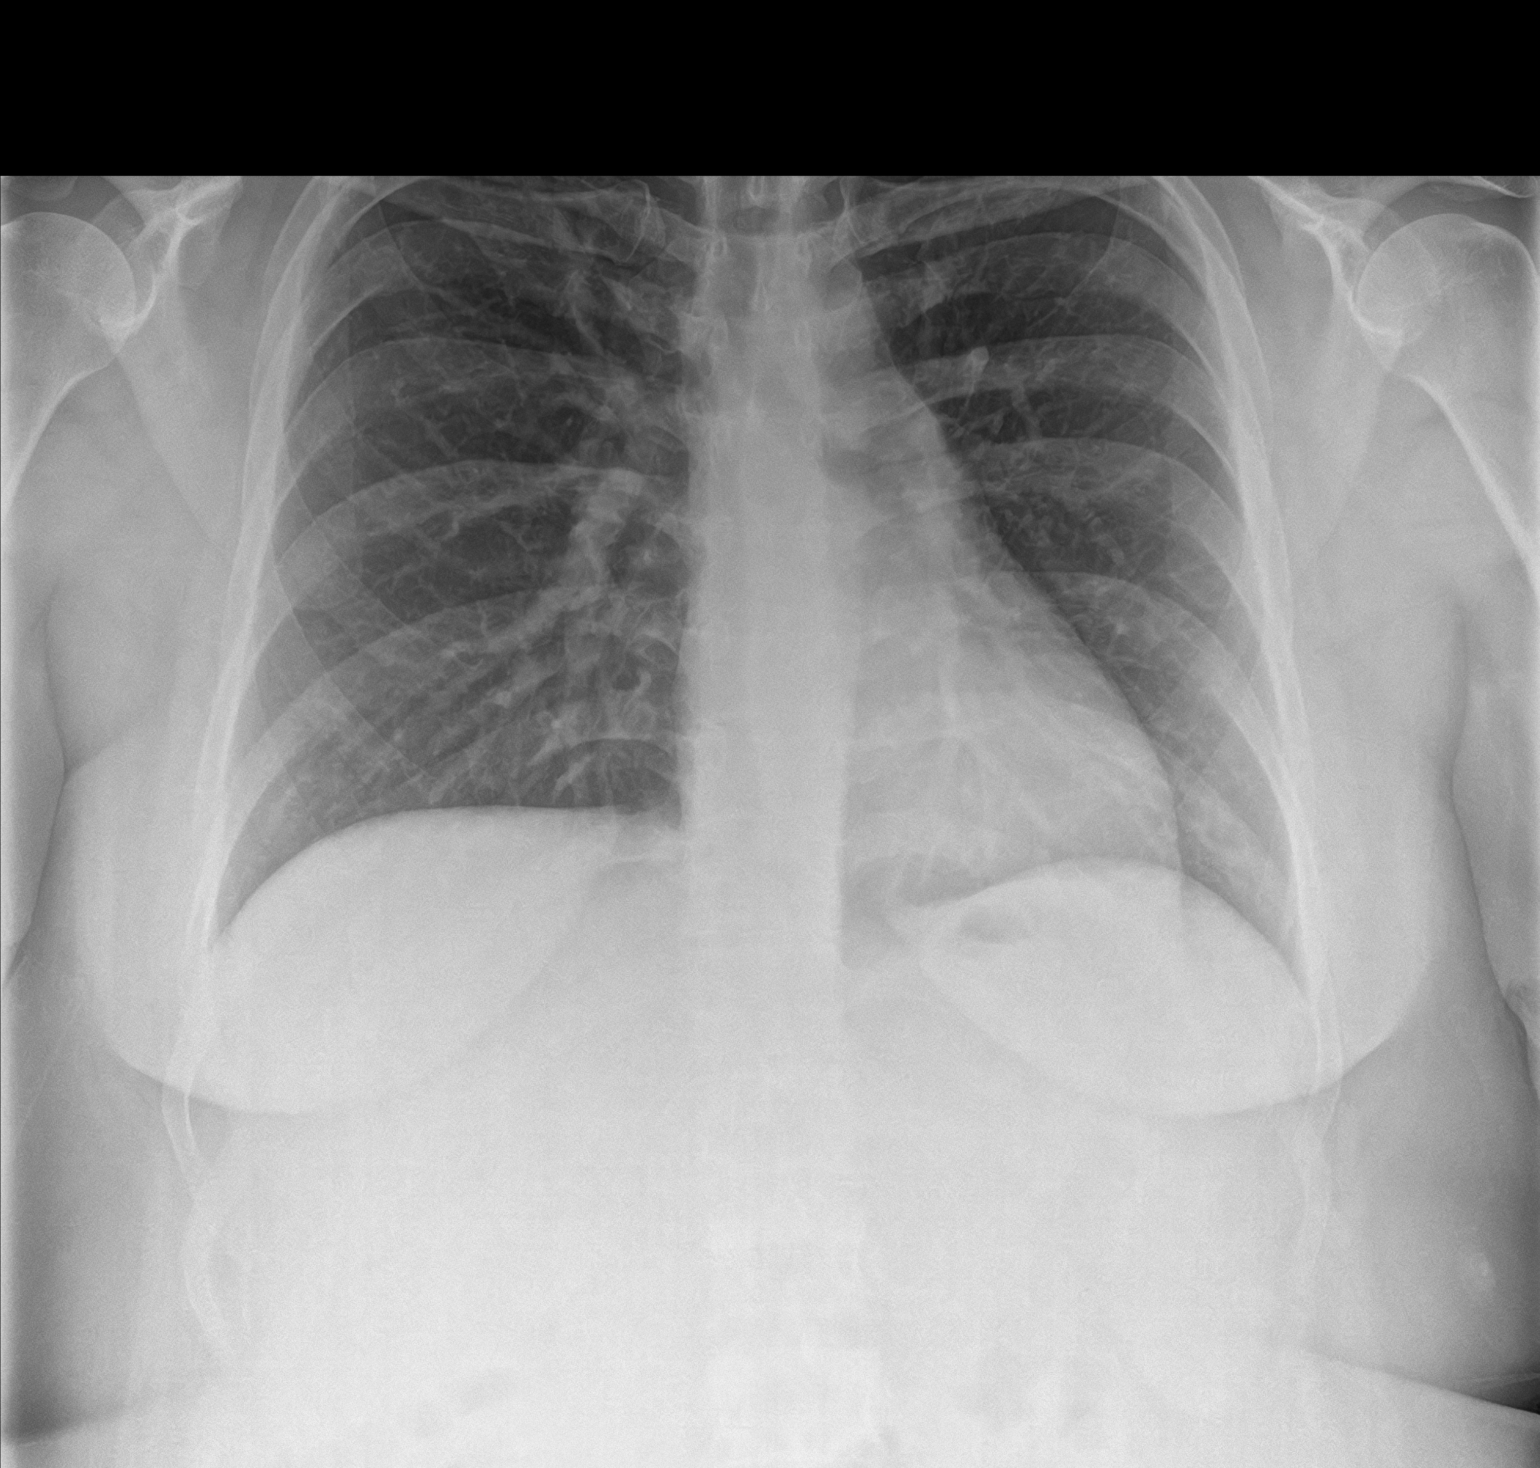
[im 2/2]
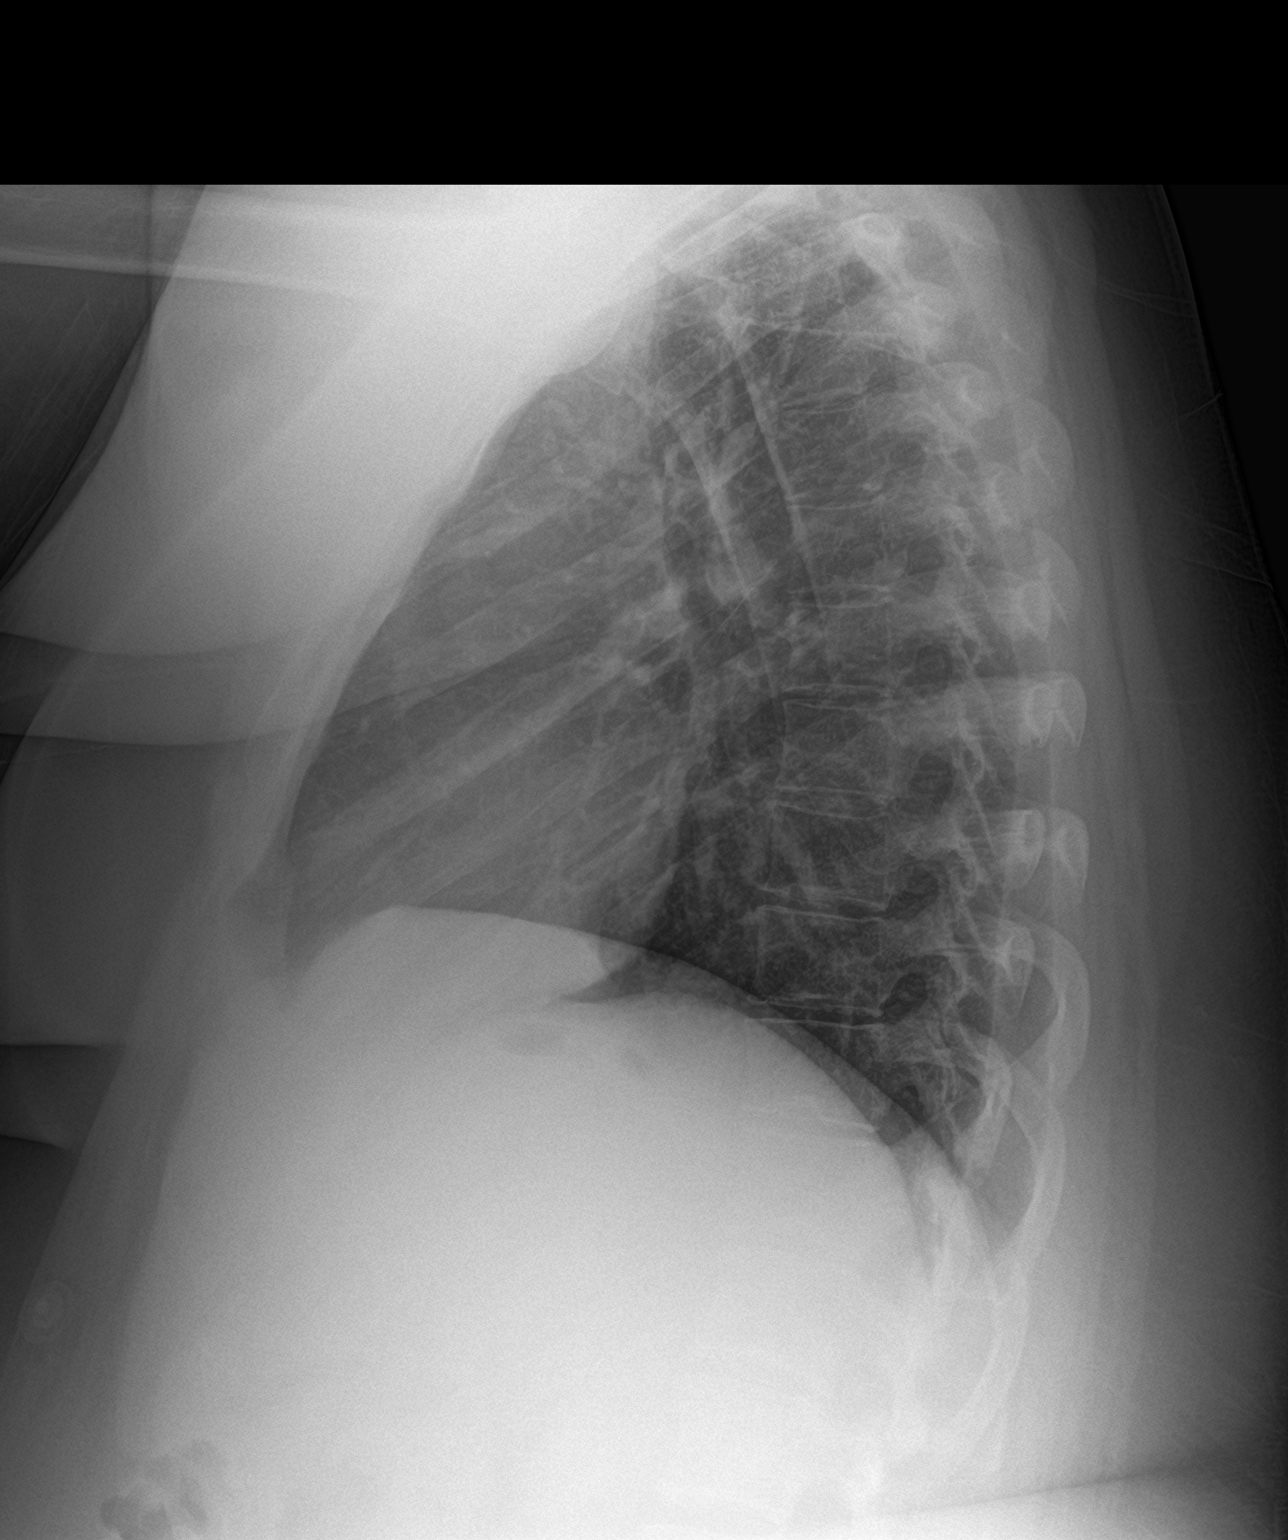

[2 of 2 positions shown; findings below may reference images not displayed]

FINDINGS: The heart size and mediastinal contours are within normal limits.
Both lungs are clear. The visualized skeletal structures are
unremarkable.
IMPRESSION: No active cardiopulmonary disease.

## 2023-12-22 ENCOUNTER — Ambulatory Visit
Admission: EM | Admit: 2023-12-22 | Discharge: 2023-12-22 | Disposition: A | Discharge status: Home / Self Care | Attending: Emergency Medicine | Admitting: Emergency Medicine

## 2023-12-22 DIAGNOSIS — J0100 Acute maxillary sinusitis, unspecified: Secondary | ICD-10-CM | POA: Diagnosis not present

## 2023-12-22 HISTORY — DX: Benign intracranial hypertension: G93.2

## 2023-12-22 MED ORDER — AMOXICILLIN-POT CLAVULANATE 875-125 MG PO TABS
1.0000 | ORAL_TABLET | Freq: Two times a day (BID) | ORAL | 0 refills | Status: AC
Start: 2023-12-22 — End: ?

## 2023-12-22 NOTE — ED Triage Notes (Signed)
 Patient to Urgent Care with complaints of sore throat/ cough/ nasal congestion/ shortness of breath. Denies any fevers.   Reports symptoms started 6 days ago. Feels like symptoms are worsening.   Taking guanfacine w/ codeine. Mucinex.

## 2023-12-22 NOTE — Discharge Instructions (Addendum)
 Take the Augmentin as directed.  Follow up with your primary care provider.

## 2023-12-22 NOTE — ED Provider Notes (Signed)
 [name]    CSN: [id number] Arrival date & time: 12/22/23  1239      History   Chief Complaint Chief Complaint  Patient presents with   Nasal Congestion    HPI Mariah Acosta is a 38 y.o. female.  Patient presents with 6-day history of congestion, postnasal drip, runny nose, sore throat, hoarse voice, cough.  She has been treating her symptoms with Mucinex and codeine cough syrup.  No fever or shortness of breath.  Her medical history includes mild intermittent asthma, intracranial hypertension, migraine headache, morbid obesity, hyperlipidemia, hyperinsulinemia.  The history is provided by the patient and medical records.    Past Medical History:  Diagnosis Date   Intracranial hypertension    Migraines    Thyroid disease     There are no active problems to display for this patient.   Past Surgical History:  Procedure Laterality Date   LUMBAR PUNCTURE     LYMPH NODE BIOPSY     TONSILLECTOMY      OB History   No obstetric history on file.      Home Medications    Prior to Admission medications   Medication Sig Start Date End Date Taking? Authorizing Provider  amoxicillin-clavulanate (AUGMENTIN) 875-125 MG tablet Take 1 tablet by mouth every 12 (twelve) hours. 12/22/23  Yes Mickie Bail, NP  acetaZOLAMIDE (DIAMOX) 250 MG tablet Take 250 mg by mouth 2 (two) times daily.    [provider]  amitriptyline (ELAVIL) 10 MG tablet Take 10 mg by mouth at bedtime.    [provider]  escitalopram (LEXAPRO) 20 MG tablet Take 20 mg by mouth daily.    [provider]  levothyroxine (SYNTHROID) 75 MCG tablet Take 75 mcg by mouth daily before breakfast.    [provider]  metformin (FORTAMET) 500 MG (OSM) 24 hr tablet Take 500 mg by mouth 2 (two) times daily with a meal.    [provider]    Family History History reviewed. No pertinent family history.  Social History Social History   Tobacco Use   Smoking  status: Unknown   Smokeless tobacco: Never  Vaping Use   Vaping status: Never Used  Substance Use Topics   Alcohol use: Not Currently   Drug use: Not Currently     Allergies   Sumatriptan   Review of Systems Review of Systems  Constitutional:  Negative for chills and fever.  HENT:  Positive for congestion, postnasal drip, rhinorrhea, sore throat and voice change. Negative for ear pain.   Respiratory:  Positive for cough. Negative for shortness of breath.      Physical Exam Triage Vital Signs ED Triage Vitals  Encounter Vitals Group     BP 12/22/23 1248 121/86     Systolic BP Percentile --      Diastolic BP Percentile --      Pulse Rate 12/22/23 1248 (!) 104     Resp 12/22/23 1248 18     Temp 12/22/23 1248 98 F (36.7 C)     Temp src --      SpO2 12/22/23 1248 96 %     Weight --      Height --      Head Circumference --      Peak Flow --      Pain Score 12/22/23 1255 6     Pain Loc --      Pain Education --      Exclude from Growth Chart --  No data found.  Updated Vital Signs BP 121/86   Pulse (!) 104   Temp 98 F (36.7 C)   Resp 18   LMP 11/24/2023   SpO2 96%   Visual Acuity Right Eye Distance:   Left Eye Distance:   Bilateral Distance:    Right Eye Near:   Left Eye Near:    Bilateral Near:     Physical Exam Constitutional:      General: She is not in acute distress. HENT:     Right Ear: Tympanic membrane normal.     Left Ear: Tympanic membrane normal.     Nose: Congestion and rhinorrhea present.     Mouth/Throat:     Mouth: Mucous membranes are moist.     Pharynx: Oropharynx is clear.  Cardiovascular:     Rate and Rhythm: Normal rate and regular rhythm.     Heart sounds: Normal heart sounds.  Pulmonary:     Effort: Pulmonary effort is normal. No respiratory distress.     Breath sounds: Normal breath sounds. No wheezing.  Neurological:     Mental Status: She is alert.      UC Treatments / Results  Labs (all labs ordered are  listed, but only abnormal results are displayed) Labs Reviewed - No data to display  EKG   Radiology No results found.  Procedures Procedures (including critical care time)  Medications Ordered in UC Medications - No data to display  Initial Impression / Assessment and Plan / UC Course  I have reviewed the triage vital signs and the nursing notes.  Pertinent labs & imaging results that were available during my care of the patient were reviewed by me and considered in my medical decision making (see chart for details).   Acute sinusitis.  Lungs are clear and O2 sat is 96% on room air.  Treating today with Augmentin.  Instructed patient to follow-up with her PCP.  Education provided on acute sinusitis.  She agrees to plan of care.   Final Clinical Impressions(s) / UC Diagnoses   Final diagnoses:  Acute non-recurrent maxillary sinusitis     Discharge Instructions      Take the Augmentin as directed.  Follow up with your primary care provider.        ED Prescriptions     Medication Sig Dispense Auth. Provider   amoxicillin-clavulanate (AUGMENTIN) 875-125 MG tablet Take 1 tablet by mouth every 12 (twelve) hours. 14 tablet Mickie Bail, NP      PDMP not reviewed this encounter.   Mickie Bail, NP 12/22/23 1319

## 2023-12-27 ENCOUNTER — Emergency Department: Admission: EM | Admit: 2023-12-27 | Discharge: 2023-12-27 | Disposition: A | Discharge status: Home / Self Care

## 2023-12-27 ENCOUNTER — Other Ambulatory Visit: Payer: Self-pay

## 2023-12-27 ENCOUNTER — Emergency Department

## 2023-12-27 DIAGNOSIS — R519 Headache, unspecified: Secondary | ICD-10-CM | POA: Insufficient documentation

## 2023-12-27 LAB — RESP PANEL BY RT-PCR (RSV, FLU A&B, COVID)  RVPGX2
Influenza A by PCR: NEGATIVE
Influenza B by PCR: NEGATIVE
Resp Syncytial Virus by PCR: NEGATIVE
SARS Coronavirus 2 by RT PCR: NEGATIVE

## 2023-12-27 MED ORDER — METOCLOPRAMIDE HCL 10 MG PO TABS
10.0000 mg | ORAL_TABLET | Freq: Once | ORAL | Status: AC
  Administered 2023-12-27: 10 mg via ORAL
  Filled 2023-12-27: qty 1

## 2023-12-27 MED ORDER — ACETAMINOPHEN 500 MG PO TABS
1000.0000 mg | ORAL_TABLET | Freq: Once | ORAL | Status: AC
  Administered 2023-12-27: 1000 mg via ORAL
  Filled 2023-12-27: qty 2

## 2023-12-27 MED ORDER — ACETAMINOPHEN 500 MG PO TABS: 1000.0000 mg | ORAL_TABLET | Freq: Four times a day (QID) | ORAL | 2 refills | Status: AC | PRN

## 2023-12-27 MED ORDER — KETOROLAC TROMETHAMINE 30 MG/ML IJ SOLN
30.0000 mg | Freq: Once | INTRAMUSCULAR | Status: AC
  Administered 2023-12-27: 30 mg via INTRAMUSCULAR
  Filled 2023-12-27: qty 1

## 2023-12-27 NOTE — ED Provider Notes (Signed)
 [facility] Provider Note    Event Date/Time   First MD Initiated Contact with Patient 12/27/23 1559     (approximate)   History   Hypertension  Pt here with intercranial hypertension. Pt is having a lot of pressure behind her eyes. Pt denies any cp or sob.    HPI Mariah Acosta is a 38 y.o. female BMI greater than 40, occipital neuralgia, anxiety,, chronic daily headaches, intracranial hypertension presents for evaluation of headache - On my evaluation, patient states she has had intermittent headaches over the past few days.  Not worst headache of life, not acute onset, no proceeding trauma.  No vision changes or vomiting.  Does not have any significant headache currently.  Notes that she has been having some sensation of pressure behind her eyes.  Was recently started on Augmentin for presumed sinusitis about 4-5 days ago, notes her congestion is clearing. - No focal weakness, no significant pain currently - Did not take any of her usual headache medications today  Per review of chart including neurology visit note from 10/13/2023, patient has been having worsened headaches since late December 2024.  Appears she had an MRI in March 2024 noticing a partially empty sella which is reportedly a normal anatomic variant but can sometimes be associated with idiopathic intracranial hypertension.  Patient tells me she has had a lumbar puncture previously to confirm this diagnosis is and is on regular acetazolamide dosing for this.  Follows with neurology regarding this diagnosis.      Physical Exam   Triage Vital Signs: ED Triage Vitals [12/27/23 1220]  Encounter Vitals Group     BP (!) 134/95     Systolic BP Percentile      Diastolic BP Percentile      Pulse Rate 99     Resp 17     Temp 97.7 F (36.5 C)     Temp Source Oral     SpO2 99 %     Weight 292 lb 15.9 oz (132.9 kg)     Height 5\' 9"  (1.753 m)     Head Circumference      Peak Flow      Pain  Score 9     Pain Loc      Pain Education      Exclude from Growth Chart     Most recent vital signs: Vitals:   12/27/23 1220 12/27/23 1612  BP: (!) 134/95 (!) 132/96  Pulse: 99 90  Resp: 17 16  Temp: 97.7 F (36.5 C) 98.1 F (36.7 C)  SpO2: 99% 98%     General: Awake, no distress.  CV:  Good peripheral perfusion. RRR, RP 2+ Neck:  Full range of motion, no meningismus HEENT: PERRL Resp:  Normal effort. CTAB Abd:  No distention. Nontender to deep palpation throughout Neuro:  Aox4, CN II-XII intact, FNF wnl, finger taps fast b/l, 5/5 strength in bilateral finger extension/grip, EHL/FHL. BUE AG 10+ sec no drift, BLE AG 5+ sec no drift. Ambulates with steady gait. SILT. Negative Rhomberg.    ED Results / Procedures / Treatments   Labs (all labs ordered are listed, but only abnormal results are displayed) Labs Reviewed  RESP PANEL BY RT-PCR (RSV, FLU A&B, COVID)  [id number]     EKG  N/a   RADIOLOGY ED course below.  PROCEDURES:  Critical Care performed: No  Procedures   MEDICATIONS ORDERED IN ED: Medications  ketorolac (TORADOL) 30 MG/ML injection 30 mg (has no  administration in time range)  acetaminophen (TYLENOL) tablet 1,000 mg (has no administration in time range)  metoCLOPramide (REGLAN) tablet 10 mg (has no administration in time range)     IMPRESSION / MDM / ASSESSMENT AND PLAN / ED COURSE  I reviewed the triage vital signs and the nursing notes.                              DDX/MDM/AP: Differential diagnosis includes, but is not limited to, benign headaches including possibility of migraine, doubt secondary to intracranial hypertension given intermittent nature of symptoms this patient with known chronic history of headaches.  Doubt intracranial hemorrhage or mass.  Consider ongoing sinusitis.  No concern for meningitis, encephalitis at this time.  Plan: - Screening CT head from triage negative - Swab negative  Patient's presentation is most  consistent with acute presentation with potential threat to life or bodily function.  ED course below.  Workup unremarkable.  Patient very well-appearing here with nonfocal neurologic exam.  CT head negative.  Discussed that she should use her regular headache medications when she develops similar type symptoms.  Also encouraged her to continue with her acetazolamide for her no intracranial hypertension, no indication for emergent lumbar puncture at this time but strongly encouraged patient to follow-up with her neurologist for reevaluation.  Treated with Tylenol, Toradol (states no possibility she could be pregnant given disease, Reglan.  Also plan for PMD follow-up.  ED return precautions in place.  Clinical Course as of 12/27/23 1618  Mon Dec 27, 2023  1602 CT head with no acute pathology on my interpretation, formal radiology read reviewed  Viral swab negative [MM]    Clinical Course User Index [MM] Collis Deaner, MD     FINAL CLINICAL IMPRESSION(S) / ED DIAGNOSES   Final diagnoses:  Nonintractable episodic headache, unspecified headache type     Rx / DC Orders   ED Discharge Orders          Ordered    acetaminophen (TYLENOL) 500 MG tablet  Every 6 hours PRN        12/27/23 1618             Note:  This document was prepared using Dragon voice recognition software and may include unintentional dictation errors.   Collis Deaner, MD 12/27/23 [phone]

## 2023-12-27 NOTE — Discharge Instructions (Addendum)
 Your evaluation in the emergency department was overall reassuring.  I recommend you can complete your course of Augmentin, continue with your already prescribed acetazolamide, and use your headache medications as needed for any recurrent mild symptoms.  Please do follow-up with your primary care doctor and neurologist for reevaluation.  Return to the emergency department with any new or worsening symptoms.  I prescribed you Tylenol to take in addition to your regular headache medications.

## 2023-12-27 NOTE — ED Triage Notes (Signed)
 Pt here with intercranial hypertension. Pt is having a lot of pressure behind her eyes. Pt denies any cp or sob.
